# Patient Record
Sex: Female | Born: 1995 | Race: White | Hispanic: No | Marital: Single | Smoking: Never smoker
Health system: Southern US, Community
[De-identification: ages and names within clinical notes are randomized; demographics above are authoritative.]

## PROBLEM LIST (undated history)

## (undated) DIAGNOSIS — F909 Attention-deficit hyperactivity disorder, unspecified type: Secondary | ICD-10-CM

## (undated) DIAGNOSIS — F913 Oppositional defiant disorder: Secondary | ICD-10-CM

## (undated) HISTORY — PX: SHOULDER ARTHROSCOPY: SHX128

## (undated) HISTORY — DX: Oppositional defiant disorder: F91.3

## (undated) HISTORY — PX: SHOULDER SURGERY: SHX246

## (undated) HISTORY — DX: Attention-deficit hyperactivity disorder, unspecified type: F90.9

---
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
## 2014-01-06 ENCOUNTER — Ambulatory Visit: Payer: 59 | Attending: Sports Medicine | Admitting: Physical Therapy

## 2014-01-06 DIAGNOSIS — IMO0001 Reserved for inherently not codable concepts without codable children: Secondary | ICD-10-CM | POA: Insufficient documentation

## 2014-01-06 DIAGNOSIS — M25519 Pain in unspecified shoulder: Secondary | ICD-10-CM | POA: Insufficient documentation

## 2014-01-06 DIAGNOSIS — R5381 Other malaise: Secondary | ICD-10-CM | POA: Insufficient documentation

## 2014-01-07 ENCOUNTER — Ambulatory Visit: Payer: 59 | Admitting: *Deleted

## 2014-01-11 ENCOUNTER — Ambulatory Visit: Payer: 59 | Admitting: Physical Therapy

## 2014-01-12 ENCOUNTER — Ambulatory Visit: Payer: 59 | Admitting: *Deleted

## 2014-01-14 ENCOUNTER — Ambulatory Visit: Payer: 59 | Admitting: *Deleted

## 2014-01-19 ENCOUNTER — Ambulatory Visit: Payer: 59 | Admitting: *Deleted

## 2014-01-20 ENCOUNTER — Encounter: Payer: 59 | Admitting: Physical Therapy

## 2014-01-21 ENCOUNTER — Encounter: Payer: 59 | Admitting: *Deleted

## 2014-01-21 ENCOUNTER — Ambulatory Visit: Payer: 59 | Admitting: *Deleted

## 2014-01-25 ENCOUNTER — Encounter: Payer: 59 | Admitting: Physical Therapy

## 2014-01-26 ENCOUNTER — Ambulatory Visit: Payer: 59 | Admitting: *Deleted

## 2014-01-28 ENCOUNTER — Ambulatory Visit: Payer: 59 | Admitting: *Deleted

## 2014-02-02 ENCOUNTER — Ambulatory Visit: Payer: 59 | Attending: Sports Medicine | Admitting: *Deleted

## 2014-02-02 DIAGNOSIS — M25519 Pain in unspecified shoulder: Secondary | ICD-10-CM | POA: Insufficient documentation

## 2014-02-02 DIAGNOSIS — R5381 Other malaise: Secondary | ICD-10-CM | POA: Insufficient documentation

## 2014-02-02 DIAGNOSIS — IMO0001 Reserved for inherently not codable concepts without codable children: Secondary | ICD-10-CM | POA: Insufficient documentation

## 2014-02-04 ENCOUNTER — Ambulatory Visit: Payer: 59 | Admitting: Physical Therapy

## 2014-02-08 ENCOUNTER — Encounter: Payer: 59 | Admitting: Physical Therapy

## 2014-02-08 ENCOUNTER — Ambulatory Visit: Payer: 59 | Admitting: Physical Therapy

## 2014-02-09 ENCOUNTER — Ambulatory Visit: Payer: 59 | Admitting: *Deleted

## 2014-02-16 ENCOUNTER — Encounter: Payer: 59 | Admitting: Physical Therapy

## 2014-02-18 ENCOUNTER — Encounter: Payer: 59 | Admitting: *Deleted

## 2014-03-05 ENCOUNTER — Encounter (HOSPITAL_COMMUNITY): Payer: Self-pay | Admitting: Emergency Medicine

## 2014-03-05 DIAGNOSIS — F913 Oppositional defiant disorder: Secondary | ICD-10-CM | POA: Insufficient documentation

## 2014-03-05 DIAGNOSIS — G8918 Other acute postprocedural pain: Secondary | ICD-10-CM | POA: Insufficient documentation

## 2014-03-05 DIAGNOSIS — F909 Attention-deficit hyperactivity disorder, unspecified type: Secondary | ICD-10-CM | POA: Insufficient documentation

## 2014-03-05 DIAGNOSIS — Z79899 Other long term (current) drug therapy: Secondary | ICD-10-CM | POA: Insufficient documentation

## 2014-03-05 NOTE — ED Notes (Signed)
The pt is c/o lt shoulder pain since she had ligament surgery on Wednesday. Her pain med has been doubled and she is still in pain.  They were told to come to the ed

## 2014-03-06 ENCOUNTER — Emergency Department (HOSPITAL_COMMUNITY)
Admission: EM | Admit: 2014-03-06 | Discharge: 2014-03-06 | Disposition: A | Discharge status: Home / Self Care | Payer: 59 | Attending: Emergency Medicine | Admitting: Emergency Medicine

## 2014-03-06 DIAGNOSIS — G8918 Other acute postprocedural pain: Secondary | ICD-10-CM

## 2014-03-06 MED ORDER — HYDROMORPHONE HCL PF 1 MG/ML IJ SOLN
1.0000 mg | Freq: Once | INTRAMUSCULAR | Status: AC
  Administered 2014-03-06: 1 mg via INTRAVENOUS
  Filled 2014-03-06: qty 1

## 2014-03-06 MED ORDER — ONDANSETRON HCL 4 MG/2ML IJ SOLN
4.0000 mg | Freq: Once | INTRAMUSCULAR | Status: AC
  Administered 2014-03-06: 4 mg via INTRAVENOUS
  Filled 2014-03-06: qty 2

## 2014-03-06 MED ORDER — KETOROLAC TROMETHAMINE 30 MG/ML IJ SOLN
30.0000 mg | Freq: Once | INTRAMUSCULAR | Status: AC
  Administered 2014-03-06: 30 mg via INTRAVENOUS
  Filled 2014-03-06: qty 1

## 2014-03-06 MED ORDER — OXYCODONE-ACETAMINOPHEN 5-325 MG PO TABS: 2.0000 | ORAL_TABLET | ORAL | Status: DC | PRN

## 2014-03-06 NOTE — Discharge Instructions (Signed)
Pain Relief Preoperatively and Postoperatively °Being a good patient does not mean being a silent one. If you have questions, problems, or concerns about the pain you may feel after surgery, let your caregiver know. Patients have the right to assessment and management of pain. The treatment of pain after surgery is important to speed up recovery and return to normal activities. Severe pain after surgery, and the fear or anxiety associated with that pain, may cause extreme discomfort that: °· Prevents sleep. °· Decreases the ability to breathe deeply and cough. This can cause pneumonia or other upper airway infections. °· Causes your heart to beat faster and your blood pressure to be higher. °· Increases the risk for constipation and bloating. °· Decreases the ability of wounds to heal. °· May result in depression, increased anxiety, and feelings of helplessness. °Relief of pain before surgery is also important because it will lessen the pain after surgery. Patients who receive both pain relief before and after surgery experience greater pain relief than those who only receive pain relief after surgery. Let your caregiver know if you are having uncontrolled pain. This is very important. Pain after surgery is more difficult to manage if it is permitted to become severe, so prompt and adequate treatment of acute pain is necessary. °PAIN CONTROL METHODS °Your caregivers follow policies and procedures about the management of patient pain. These guidelines should be explained to you before surgery. Plans for pain control after surgery must be mutually decided upon and instituted with your full understanding and agreement. Do not be afraid to ask questions regarding the care you are receiving. There are many different ways your caregivers will attempt to control your pain, including the following methods. °As needed pain control °· You may be given pain medicine either through your intravenous (IV) tube, or as a pill or  liquid you can swallow. You will need to let your caregiver know when you are having pain. Then, your caregiver will give you the pain medicine ordered for you. °· Your pain medicine may make you constipated. If constipation occurs, drink more liquids if you can. Your caregiver may have you take a mild laxative. °IV patient-controlled analgesia pump (PCA pump) °· You can get your pain medicine through the IV tube which goes into your vein. You are able to control the amount of pain medicine that you get. The pain medicine flows in through an IV tube and is controlled by a pump. This pump gives you a set amount of pain medicine when you push the button hooked up to it. Nobody should push this button but you or someone specifically assigned by you to do so. It is set up to keep you from accidentally giving yourself too much pain medicine. You will be able to start using your pain pump in the recovery room after your surgery. This method can be helpful for most types of surgery. °· If you are still having too much pain, tell your caregiver. Also, tell your caregiver if you are feeling too sleepy or nauseous. °Continuous epidural pain control °· A thin, soft tube (catheter) is put into your back. Pain medicine flows through the catheter to lessen pain in the part of your body where the surgery is done. Continuous epidural pain control may work best for you if you are having surgery on your chest, abdomen, hip area, or legs. The epidural catheter is usually put into your back just before surgery. The catheter is left in until you can eat and take medicine by mouth. In most cases,   this may take 2 to 3 days. °· Giving pain medicine through the epidural catheter may help you heal faster because: °· Your bowel gets back to normal faster. °· You can get back to eating sooner. °· You can be up and walking sooner. °Medicine that numbs the area (local anesthetic) °· You may receive an injection of pain medicine near where the  pain is (local infiltration). °· You may receive an injection of pain medicine near the nerve that controls the sensation to a specific part of the body (peripheral nerve block). °· Medicine may be put in the spine to block pain (spinal block). °Opioids °· Moderate to moderately severe acute pain after surgery may respond to opioids. Opioids are narcotic pain medicine. Opioids are often combined with non-narcotic medicines to improve pain relief, diminish the risk of side effects, and reduce the chance of addiction. °· If you follow your caregiver's directions about taking opioids and you do not have a history of substance abuse, your risk of becoming addicted is exceptionally small. Opioids are given for short periods of time in careful doses to prevent addiction. °Other methods of pain control include: °· Steroids. °· Physical therapy. °· Heat and cold therapy. °· Compression, such as wrapping an elastic bandage around the area of pain. °· Massage. °These various ways of controlling pain may be used together. Combining different methods of pain control is called multimodal analgesia. Using this approach has many benefits, including being able to eat, move around, and leave the hospital sooner. °Document Released: 03/09/2003 Document Revised: 03/10/2012 Document Reviewed: 03/13/2011 °Exit[employer]® Patient Information ©2014 [employer]. ° °

## 2014-03-06 NOTE — ED Provider Notes (Signed)
CSN: [id number]     Arrival date & time 03/05/14  2315 History   First MD Initiated Contact with Patient 03/06/14 0254     Chief Complaint  Patient presents with  . Arm Pain     (Consider location/radiation/quality/duration/timing/severity/associated sxs/prior Treatment) HPI History provided by patient and her mother bedside. Left shoulder surgery 2 days ago, arthroscopic surgery.  Since that time having severe shoulder pain, wearing sling as directed. She has been taking increased amounts of pain medication per her orthopedic surgeon - today has been taking 3 oxycodone every 4 hours with persistent severe pain. She is also taking Valium every 6 hours. No NSAIDs. No fevers or chills. Mother has been replacing wound the bandages without noted erythema or drainage from wounds. Pain is sharp in quality and severe, worse with any movement. No associated weakness or numbness. Patient unable to sleep and was referred to the emergency department for ongoing pain. Past Medical History  Diagnosis Date  . ADHD (attention deficit hyperactivity disorder)   . Oppositional defiant disorder    History reviewed. No pertinent past surgical history. Family History  Problem Relation Age of Onset  . Bipolar disorder Maternal Aunt   . ADD / ADHD Cousin    History  Substance Use Topics  . Smoking status: Never Smoker   . Smokeless tobacco: Never Used  . Alcohol Use: No   OB History   Grav Para Term Preterm Abortions TAB SAB Ect Mult Living                 Review of Systems  Constitutional: Negative for fever and chills.  Respiratory: Negative for shortness of breath.   Cardiovascular: Negative for chest pain.  Gastrointestinal: Negative for abdominal pain.  Genitourinary: Negative for flank pain.  Musculoskeletal: Negative for back pain, neck pain and neck stiffness.  Skin: Negative for rash.  Neurological: Negative for weakness and numbness.  All other systems reviewed and are  negative.      Allergies  Pine needle and Amoxicillin  Home Medications   Current Outpatient Rx  Name  Route  Sig  Dispense  Refill  . AZURETTE 0.15-0.02/0.01 MG (21/5) tablet   Oral   Take 1 tablet by mouth daily.         . diazepam (VALIUM) 2 MG tablet   Oral   Take 2-4 mg by mouth every 6 (six) hours as needed for muscle spasms.         Marland Kitchen. oxyCODONE (OXY IR/ROXICODONE) 5 MG immediate release tablet   Oral   Take 5-15 mg by mouth every 4 (four) hours as needed for severe pain.          BP 129/80  Pulse 93  Temp(Src) 99.3 F (37.4 C) (Oral)  Resp 18  SpO2 99% Physical Exam  Constitutional: She is oriented to person, place, and time. She appears well-developed and well-nourished.  HENT:  Head: Normocephalic and atraumatic.  Eyes: EOM are normal. Pupils are equal, round, and reactive to light.  Neck: Neck supple.  Cardiovascular: Normal rate, regular rhythm and intact distal pulses.   Pulmonary/Chest: Effort normal and breath sounds normal. No respiratory distress.  Abdominal: Soft. She exhibits no distension.  Musculoskeletal:  Left Upper extremity - sling in place, decreased range of motion at shoulder secondary to pain. Distal motor and sensorium to light touch intact. Equal radial pulses. Tender on the left shoulder with diffuse swelling. Surgical wounds appear well with Steri-Strips in place. No erythema or increased warmth  to touch. No wound drainage.  Neurological: She is alert and oriented to person, place, and time.  Skin: Skin is warm and dry.    ED Course  Procedures (including critical care time) Labs Review Labs Reviewed - No data to display Imaging Review No results found.  IV Dilaudid. IV Zofran. IV Toradol.  5:18 AM on recheck, pain is gone. She feels significantly better has been resting in the ER and feels comfortable with plan discharge home. Patient and her mother are concerned that they will run out of medications over the weekend, she  shows me prescription bottle with 6 pills left in the oxycodone. Will provide short course of oxycodone to get through the weekend and plan followup with orthopedic surgeon on Monday.  Infection precautions provided and verbalizes understood. Wounds are well appearing at this time without evidence of infection. No fevers.  MDM   Diagnosis: Postoperative pain  Shoulder surgery 2 days ago, pain not controlled with oxycodone and Valium at home.  IV medications provided as above including IV narcotics Pain improved Vital signs and nursing notes reviewed and considered - stable and appropriate for discharge at this time.    Sunnie Nielsen, MD 03/06/14 [phone]

## 2014-04-08 ENCOUNTER — Ambulatory Visit: Payer: 59 | Attending: Orthopedic Surgery

## 2014-04-08 DIAGNOSIS — M25519 Pain in unspecified shoulder: Secondary | ICD-10-CM | POA: Diagnosis not present

## 2014-04-08 DIAGNOSIS — Z9889 Other specified postprocedural states: Secondary | ICD-10-CM | POA: Insufficient documentation

## 2014-04-08 DIAGNOSIS — M25619 Stiffness of unspecified shoulder, not elsewhere classified: Secondary | ICD-10-CM | POA: Insufficient documentation

## 2014-04-08 DIAGNOSIS — IMO0001 Reserved for inherently not codable concepts without codable children: Secondary | ICD-10-CM | POA: Diagnosis present

## 2014-04-12 ENCOUNTER — Ambulatory Visit: Payer: 59 | Admitting: Physical Therapy

## 2014-04-12 DIAGNOSIS — IMO0001 Reserved for inherently not codable concepts without codable children: Secondary | ICD-10-CM | POA: Diagnosis not present

## 2014-04-13 ENCOUNTER — Ambulatory Visit: Payer: 59 | Admitting: Physical Therapy

## 2014-04-13 DIAGNOSIS — IMO0001 Reserved for inherently not codable concepts without codable children: Secondary | ICD-10-CM | POA: Diagnosis not present

## 2014-04-15 ENCOUNTER — Ambulatory Visit: Payer: 59 | Admitting: Physical Therapy

## 2014-04-15 DIAGNOSIS — IMO0001 Reserved for inherently not codable concepts without codable children: Secondary | ICD-10-CM | POA: Diagnosis not present

## 2014-04-19 ENCOUNTER — Ambulatory Visit: Payer: 59 | Admitting: *Deleted

## 2014-04-19 DIAGNOSIS — IMO0001 Reserved for inherently not codable concepts without codable children: Secondary | ICD-10-CM | POA: Diagnosis not present

## 2014-04-20 ENCOUNTER — Ambulatory Visit: Payer: 59 | Admitting: *Deleted

## 2014-04-20 DIAGNOSIS — IMO0001 Reserved for inherently not codable concepts without codable children: Secondary | ICD-10-CM | POA: Diagnosis not present

## 2014-04-22 ENCOUNTER — Ambulatory Visit: Payer: 59 | Admitting: Physical Therapy

## 2014-04-22 DIAGNOSIS — IMO0001 Reserved for inherently not codable concepts without codable children: Secondary | ICD-10-CM | POA: Diagnosis not present

## 2014-04-26 ENCOUNTER — Ambulatory Visit: Payer: 59 | Admitting: Physical Therapy

## 2014-04-26 DIAGNOSIS — IMO0001 Reserved for inherently not codable concepts without codable children: Secondary | ICD-10-CM | POA: Diagnosis not present

## 2014-04-27 ENCOUNTER — Ambulatory Visit: Payer: 59 | Admitting: *Deleted

## 2014-04-27 DIAGNOSIS — IMO0001 Reserved for inherently not codable concepts without codable children: Secondary | ICD-10-CM | POA: Diagnosis not present

## 2014-04-29 ENCOUNTER — Encounter: Payer: 59 | Admitting: Physical Therapy

## 2014-05-03 ENCOUNTER — Encounter: Payer: 59 | Admitting: Physical Therapy

## 2014-05-04 ENCOUNTER — Ambulatory Visit: Payer: 59 | Attending: Orthopedic Surgery | Admitting: Physical Therapy

## 2014-05-04 DIAGNOSIS — M25519 Pain in unspecified shoulder: Secondary | ICD-10-CM | POA: Diagnosis not present

## 2014-05-04 DIAGNOSIS — Z9889 Other specified postprocedural states: Secondary | ICD-10-CM | POA: Insufficient documentation

## 2014-05-04 DIAGNOSIS — M25619 Stiffness of unspecified shoulder, not elsewhere classified: Secondary | ICD-10-CM | POA: Insufficient documentation

## 2014-05-04 DIAGNOSIS — IMO0001 Reserved for inherently not codable concepts without codable children: Secondary | ICD-10-CM | POA: Insufficient documentation

## 2014-05-06 ENCOUNTER — Ambulatory Visit: Payer: 59 | Admitting: Physical Therapy

## 2014-05-10 ENCOUNTER — Ambulatory Visit: Payer: 59 | Admitting: Physical Therapy

## 2014-05-10 DIAGNOSIS — IMO0001 Reserved for inherently not codable concepts without codable children: Secondary | ICD-10-CM | POA: Diagnosis not present

## 2014-05-11 ENCOUNTER — Ambulatory Visit: Payer: 59 | Admitting: *Deleted

## 2014-05-11 DIAGNOSIS — IMO0001 Reserved for inherently not codable concepts without codable children: Secondary | ICD-10-CM | POA: Diagnosis not present

## 2014-05-13 ENCOUNTER — Ambulatory Visit: Payer: 59 | Admitting: *Deleted

## 2014-05-13 DIAGNOSIS — IMO0001 Reserved for inherently not codable concepts without codable children: Secondary | ICD-10-CM | POA: Diagnosis not present

## 2014-05-17 ENCOUNTER — Ambulatory Visit: Payer: 59 | Admitting: *Deleted

## 2014-05-17 DIAGNOSIS — IMO0001 Reserved for inherently not codable concepts without codable children: Secondary | ICD-10-CM | POA: Diagnosis not present

## 2014-05-18 ENCOUNTER — Ambulatory Visit: Payer: 59 | Admitting: Physical Therapy

## 2014-05-18 DIAGNOSIS — IMO0001 Reserved for inherently not codable concepts without codable children: Secondary | ICD-10-CM | POA: Diagnosis not present

## 2014-05-20 ENCOUNTER — Ambulatory Visit: Payer: 59 | Admitting: Physical Therapy

## 2014-05-20 DIAGNOSIS — IMO0001 Reserved for inherently not codable concepts without codable children: Secondary | ICD-10-CM | POA: Diagnosis not present

## 2014-05-25 ENCOUNTER — Ambulatory Visit: Payer: 59 | Admitting: Physical Therapy

## 2014-05-25 DIAGNOSIS — IMO0001 Reserved for inherently not codable concepts without codable children: Secondary | ICD-10-CM | POA: Diagnosis not present

## 2014-05-27 ENCOUNTER — Encounter: Payer: 59 | Admitting: *Deleted

## 2014-05-31 ENCOUNTER — Ambulatory Visit: Payer: 59 | Attending: Orthopedic Surgery | Admitting: Physical Therapy

## 2014-05-31 DIAGNOSIS — IMO0001 Reserved for inherently not codable concepts without codable children: Secondary | ICD-10-CM | POA: Insufficient documentation

## 2014-05-31 DIAGNOSIS — Z9889 Other specified postprocedural states: Secondary | ICD-10-CM | POA: Diagnosis not present

## 2014-05-31 DIAGNOSIS — M25619 Stiffness of unspecified shoulder, not elsewhere classified: Secondary | ICD-10-CM | POA: Diagnosis not present

## 2014-05-31 DIAGNOSIS — M25519 Pain in unspecified shoulder: Secondary | ICD-10-CM | POA: Diagnosis not present

## 2014-06-01 ENCOUNTER — Ambulatory Visit: Payer: 59 | Admitting: *Deleted

## 2014-06-01 DIAGNOSIS — IMO0001 Reserved for inherently not codable concepts without codable children: Secondary | ICD-10-CM | POA: Diagnosis not present

## 2014-06-03 ENCOUNTER — Ambulatory Visit: Payer: 59 | Admitting: *Deleted

## 2014-06-03 DIAGNOSIS — IMO0001 Reserved for inherently not codable concepts without codable children: Secondary | ICD-10-CM | POA: Diagnosis not present

## 2014-06-07 ENCOUNTER — Ambulatory Visit: Payer: 59 | Admitting: Physical Therapy

## 2014-06-07 DIAGNOSIS — IMO0001 Reserved for inherently not codable concepts without codable children: Secondary | ICD-10-CM | POA: Diagnosis not present

## 2014-06-08 ENCOUNTER — Ambulatory Visit: Payer: 59 | Admitting: Physical Therapy

## 2014-06-08 DIAGNOSIS — IMO0001 Reserved for inherently not codable concepts without codable children: Secondary | ICD-10-CM | POA: Diagnosis not present

## 2014-06-10 ENCOUNTER — Ambulatory Visit: Payer: 59 | Admitting: *Deleted

## 2014-06-10 DIAGNOSIS — IMO0001 Reserved for inherently not codable concepts without codable children: Secondary | ICD-10-CM | POA: Diagnosis not present

## 2014-06-18 ENCOUNTER — Ambulatory Visit: Payer: 59 | Admitting: Physical Therapy

## 2014-06-18 DIAGNOSIS — IMO0001 Reserved for inherently not codable concepts without codable children: Secondary | ICD-10-CM | POA: Diagnosis not present

## 2014-07-01 ENCOUNTER — Ambulatory Visit: Payer: 59 | Attending: Orthopedic Surgery | Admitting: *Deleted

## 2014-07-01 DIAGNOSIS — M25619 Stiffness of unspecified shoulder, not elsewhere classified: Secondary | ICD-10-CM | POA: Diagnosis not present

## 2014-07-01 DIAGNOSIS — Z9889 Other specified postprocedural states: Secondary | ICD-10-CM | POA: Insufficient documentation

## 2014-07-01 DIAGNOSIS — M25519 Pain in unspecified shoulder: Secondary | ICD-10-CM | POA: Insufficient documentation

## 2014-07-01 DIAGNOSIS — IMO0001 Reserved for inherently not codable concepts without codable children: Secondary | ICD-10-CM | POA: Diagnosis present

## 2014-11-30 IMAGING — CR DG SHOULDER 2+V*L*
3 series · 3 of 3 positions shown · non-contrast
Comparison: None.

CLINICAL DATA: Left shoulder pain following an injury.

EXAM:
LEFT SHOULDER - 2+ VIEW

[w shoulder ap internal left]
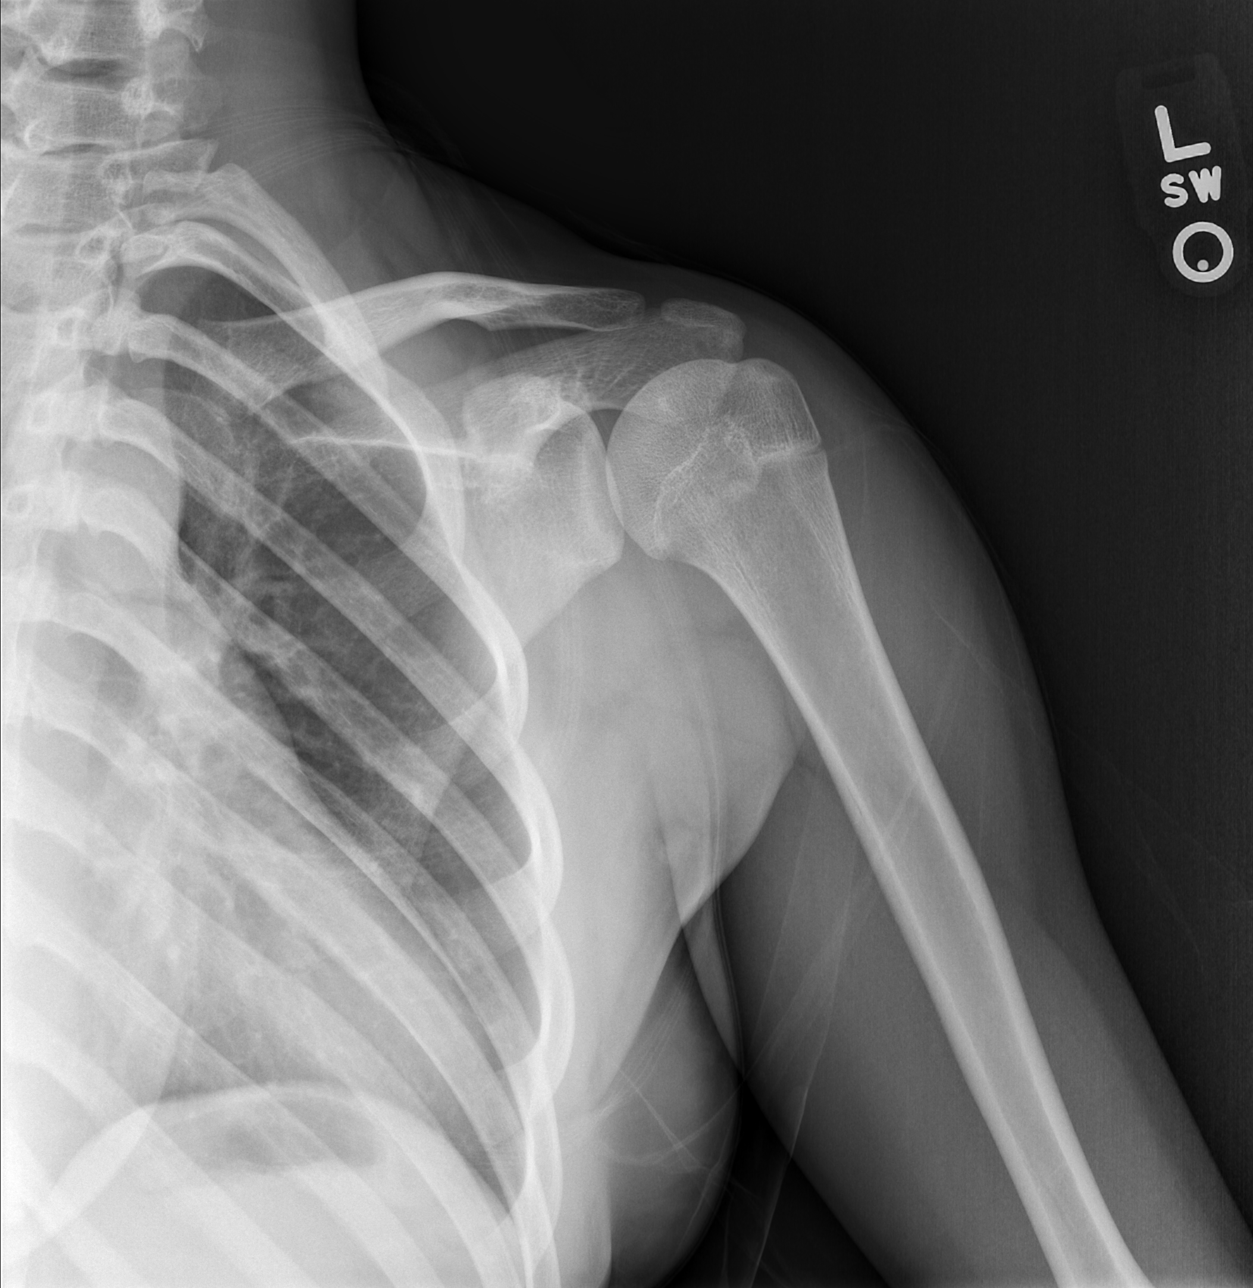

[w shoulder ap external left]
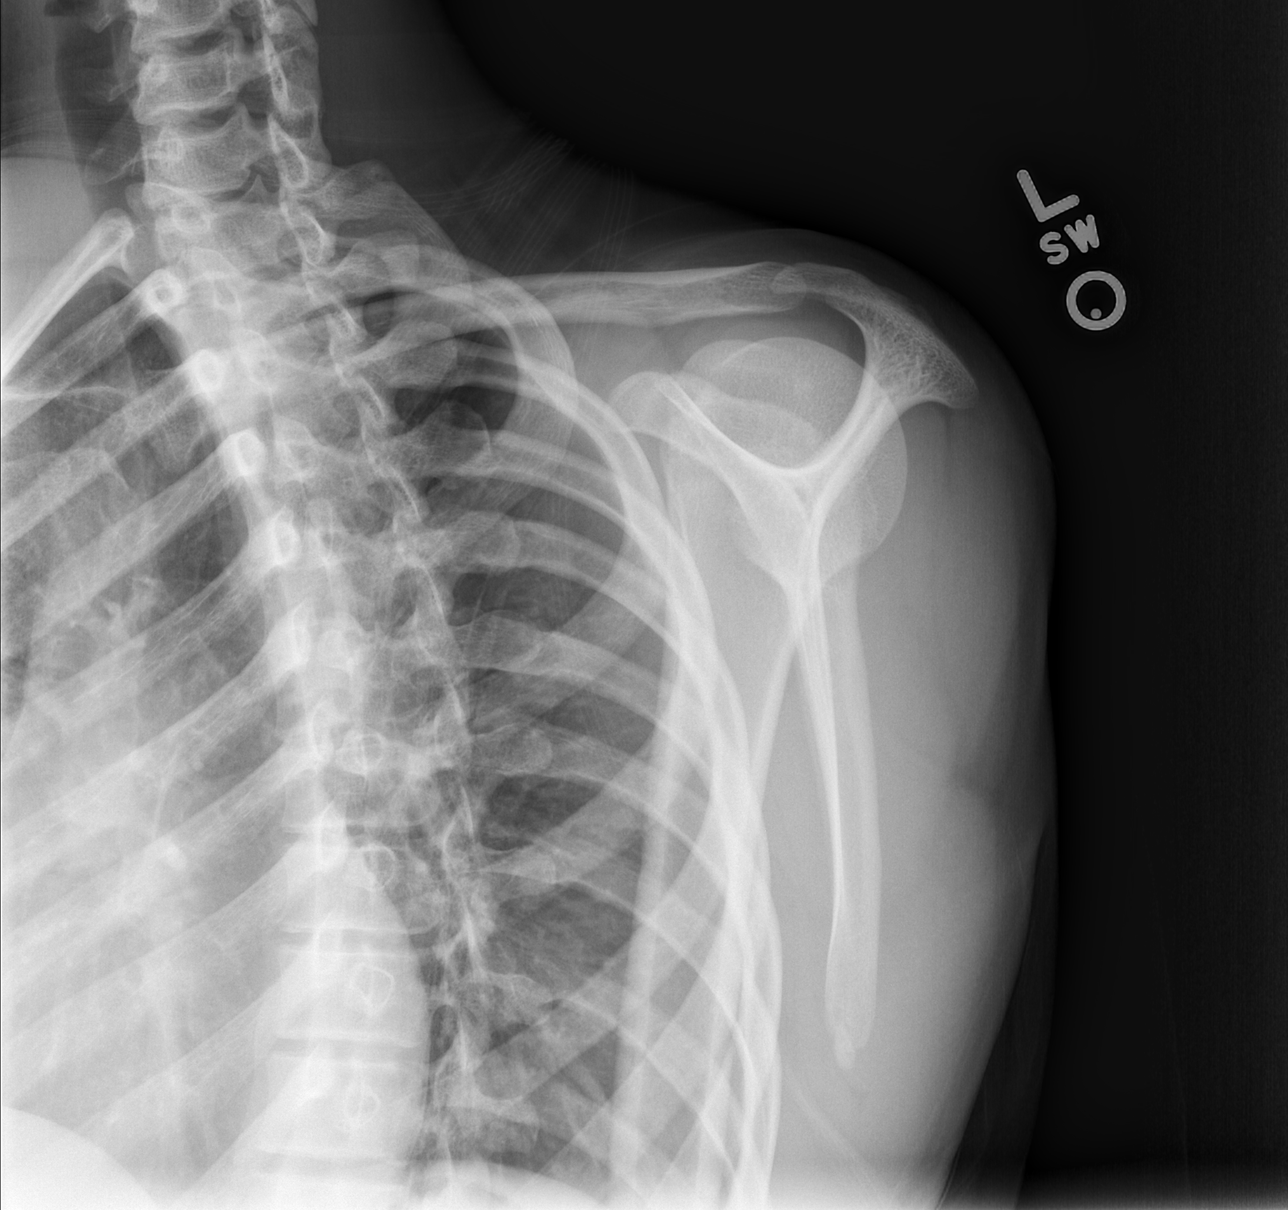

[x shoulder axillary left]
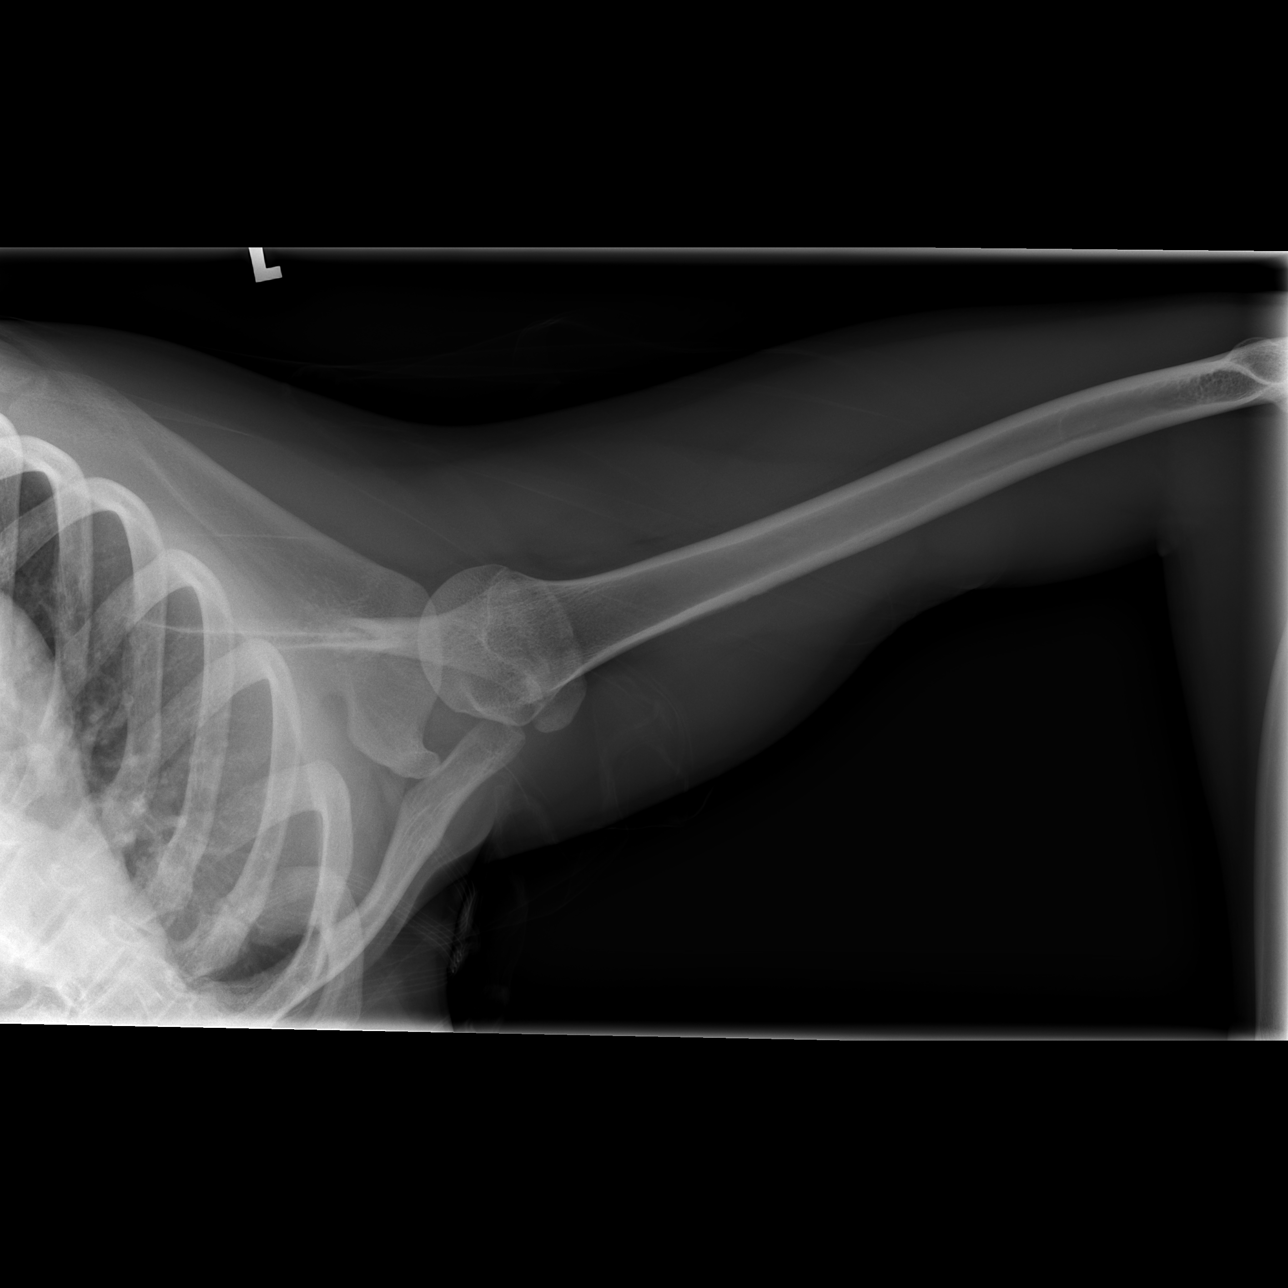

[3 of 3 positions shown; findings below may reference images not displayed]

FINDINGS: There is no evidence of fracture or dislocation. There is no
evidence of arthropathy or other focal bone abnormality. Soft
tissues are unremarkable.
IMPRESSION: Normal examination.

## 2016-06-14 ENCOUNTER — Telehealth (HOSPITAL_COMMUNITY): Payer: Self-pay

## 2016-06-14 NOTE — Telephone Encounter (Signed)
Patient is calling, she is a former patient of yours, she was last seen in 2011. She is looking for a psychiatrist and would like to know who you would recommend. Patient states that she feels more comfortable with a female. Please review and advise, thank you.

## 2016-06-14 NOTE — Telephone Encounter (Signed)
She can see Dr. Michae KavaAgarwal or Dr[facility]

## 2016-06-14 NOTE — Telephone Encounter (Signed)
I called the patient back and let her know what Dr. Lucianne MussKumar said. Patient voiced understanding and said she will probably call Dr. Garnetta BuddyFaheem

## 2017-05-30 ENCOUNTER — Telehealth (HOSPITAL_COMMUNITY): Payer: Self-pay | Admitting: Psychiatry

## 2017-07-12 ENCOUNTER — Ambulatory Visit (INDEPENDENT_AMBULATORY_CARE_PROVIDER_SITE_OTHER): Payer: BLUE CROSS/BLUE SHIELD | Admitting: Physician Assistant

## 2017-07-12 ENCOUNTER — Encounter: Payer: Self-pay | Admitting: Physician Assistant

## 2017-07-12 VITALS — BP 128/88 | HR 73 | Temp 97.4°F | Ht 63.0 in | Wt 130.4 lb

## 2017-07-12 DIAGNOSIS — F321 Major depressive disorder, single episode, moderate: Secondary | ICD-10-CM | POA: Diagnosis not present

## 2017-07-12 DIAGNOSIS — S91312D Laceration without foreign body, left foot, subsequent encounter: Secondary | ICD-10-CM | POA: Diagnosis not present

## 2017-07-12 MED ORDER — CITALOPRAM HYDROBROMIDE 20 MG PO TABS: 20.0000 mg | ORAL_TABLET | Freq: Every day | ORAL | 5 refills | Status: DC

## 2017-07-12 NOTE — Patient Instructions (Signed)

## 2017-07-12 NOTE — Progress Notes (Addendum)
BP 128/88   Pulse 73   Temp (!) 97.4 F (36.3 C) (Oral)   Ht 5' 3"  (1.6 m)   Wt 130 lb 6.4 oz (59.1 kg)   LMP 06/27/2017   BMI 23.10 kg/m    Subjective:    Patient ID: Makayla Peterson, female    DOB: 1996-11-02, 21 y.o.   MRN: [id number]  HPI: Makayla Peterson is a 21 y.o. female presenting on 07/12/2017 for Depression (Patient was emotional and crying); Anxiety; and Suture / Staple Removal (left foot)  Depression screen [facility] 2/9 07/12/2017  Decreased Interest 2  Down, Depressed, Hopeless 3  PHQ - 2 Score 5  Altered sleeping 2  Tired, decreased energy 2  Change in appetite 3  Feeling bad or failure about yourself  2  Trouble concentrating 1  Moving slowly or fidgety/restless 1  Suicidal thoughts 1  PHQ-9 Score 17    She had a distant idea of suicide about 2 years ago. She does not have a plan at this time. And she contracts for safety. She is in a situation where she has an academic scholarship patient but it is on the wrestling team at [employer]. The college wrestling team has not met her expectations. 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 She is having some difficulties with her mom and relationships. She has a boyfriend and he is supportive but they do occasionally fight. She denies any fear of abuse.  The patient has been ascended from an assistance animal. She has a dog. When she is with him she has much better mood and is less anxious. She is also having less suicidal thoughts. It may be possible for her to have the assistance animal in her dorm when she returns to college. A letter will be sent to college housing to try to accommodate this.  Relevant past medical, surgical, family and social history reviewed and updated as indicated. Allergies and medications reviewed and updated.  Past Medical History:  Diagnosis Date  . ADHD (attention deficit hyperactivity disorder)   . Oppositional defiant disorder     Past Surgical History:    Procedure Laterality Date  . SHOULDER SURGERY Left     Review of Systems  Constitutional: Negative.  Negative for activity change, fatigue and fever.  HENT: Negative.   Eyes: Negative.   Respiratory: Negative.  Negative for cough.   Cardiovascular: Negative.  Negative for chest pain.  Gastrointestinal: Negative.  Negative for abdominal pain.  Endocrine: Negative.   Genitourinary: Negative.  Negative for dysuria.  Musculoskeletal: Negative.   Skin: Negative.   Neurological: Negative.   Psychiatric/Behavioral: Positive for decreased concentration, dysphoric mood and sleep disturbance. Negative for self-injury and suicidal ideas. The patient is nervous/anxious.     Allergies as of 07/12/2017      Reactions   Pine Needle [fir Oil] Shortness Of Breath   Amoxicillin Rash      Medication List       Accurate as of 07/12/17 10:01 AM. Always use your most recent med list.          AZURETTE 0.15-0.02/0.01 MG (21/5) tablet Generic drug:  desogestrel-ethinyl estradiol Take 1 tablet by mouth daily.   citalopram 20 MG tablet Commonly known as:  CELEXA Take 1 tablet (20 mg total) by mouth daily.          Objective:    BP 128/88   Pulse 73   Temp (!) 97.4 F (36.3 C) (Oral)  Ht 5' 3"  (1.6 m)   Wt 130 lb 6.4 oz (59.1 kg)   LMP 06/27/2017   BMI 23.10 kg/m   Allergies  Allergen Reactions  . Pine Needle [Fir Oil] Shortness Of Breath  . Amoxicillin Rash    Physical Exam  Constitutional: She is oriented to person, place, and time. She appears well-developed and well-nourished.  HENT:  Head: Normocephalic and atraumatic.  Eyes: Pupils are equal, round, and reactive to light. Conjunctivae and EOM are normal.  Cardiovascular: Normal rate, regular rhythm, normal heart sounds and intact distal pulses.   Pulmonary/Chest: Effort normal and breath sounds normal.  Abdominal: Soft. Bowel sounds are normal.  Neurological: She is alert and oriented to person, place, and time. She  has normal reflexes.  Skin: Skin is warm and dry. No rash noted.  Psychiatric: Judgment and thought content normal. She is slowed and withdrawn. Cognition and memory are normal. She exhibits a depressed mood.    No results found for this or any previous visit.    Assessment & Plan:   1. Depression, major, single episode, moderate (HCC) - citalopram (CELEXA) 20 MG tablet; Take 1 tablet (20 mg total) by mouth daily.  Dispense: 30 tablet; Refill: 5  2. Laceration of left foot, subsequent encounter 5 simple interrupted sutures are removed today by me. There is good closure of the wound. Further wound care sections are given to the patient.  Continue all other maintenance medications as listed above.  Follow up plan: Return in about 4 weeks (around 08/09/2017) for recheck.  Educational handout given for depression  Terald Sleeper PA-C [place] [address]  [place],  [address] [phone]   07/12/2017, 10:01 AM

## 2017-07-15 ENCOUNTER — Encounter: Payer: Self-pay | Admitting: Physician Assistant

## 2017-08-09 ENCOUNTER — Ambulatory Visit (INDEPENDENT_AMBULATORY_CARE_PROVIDER_SITE_OTHER): Payer: BLUE CROSS/BLUE SHIELD | Admitting: Physician Assistant

## 2017-08-09 ENCOUNTER — Encounter: Payer: Self-pay | Admitting: Physician Assistant

## 2017-08-09 VITALS — BP 125/86 | HR 57 | Temp 96.7°F | Ht 63.0 in | Wt 129.2 lb

## 2017-08-09 DIAGNOSIS — F321 Major depressive disorder, single episode, moderate: Secondary | ICD-10-CM | POA: Diagnosis not present

## 2017-08-09 MED ORDER — CITALOPRAM HYDROBROMIDE 20 MG PO TABS: 20.0000 mg | ORAL_TABLET | Freq: Every day | ORAL | 2 refills | Status: DC

## 2017-08-09 MED ORDER — DESOGESTREL-ETHINYL ESTRADIOL 0.15-0.02/0.01 MG (21/5) PO TABS: 1.0000 | ORAL_TABLET | Freq: Every day | ORAL | 12 refills | Status: DC

## 2017-08-09 NOTE — Patient Instructions (Signed)
In a few days you may receive a survey in the mail or online from [employer] regarding your visit with us today. Please take a moment to fill this out. Your feedback is very important to our whole office. It can help us better understand your needs as well as improve your experience and satisfaction. Thank you for taking your time to complete it. We care about you.  Reita Shindler, PA-C  

## 2017-08-09 NOTE — Progress Notes (Signed)
BP 125/86   Pulse (!) 57   Temp (!) 96.7 F (35.9 C) (Oral)   Ht 5\' 3"  (1.6 m)   Wt 129 lb 3.2 oz (58.6 kg)   LMP 08/05/2017   BMI 22.89 kg/m    Subjective:    Patient ID: Makayla Peterson, female    DOB: 04/12/1996, 21 y.o.   MRN: [id number]  HPI: Makayla Peterson is a 21 y.o. female presenting on 08/09/2017 for Depression (4 wk rck)  This patient comes in for periodic recheck on medications and conditions including depression. She reports that there is been a significant improvement in her overall mood and feelings. The pH Q9 shows this also.  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 She is also able to keep her dog with her as comfort measures. Depression screen [facility] 2/9 08/09/2017 07/12/2017  Decreased Interest 1 2  Down, Depressed, Hopeless 1 3  PHQ - 2 Score 2 5  Altered sleeping 2 2  Tired, decreased energy 1 2  Change in appetite 1 3  Feeling bad or failure about yourself  1 2  Trouble concentrating 2 1  Moving slowly or fidgety/restless 0 1  Suicidal thoughts 0 1  PHQ-9 Score 9 17   .   All medications are reviewed today. There are no reports of any problems with the medications. All of the medical conditions are reviewed and updated.  Lab work is reviewed and will be ordered as medically necessary. There are no new problems reported with today's visit.   Relevant past medical, surgical, family and social history reviewed and updated as indicated. Allergies and medications reviewed and updated.  Past Medical History:  Diagnosis Date  . ADHD (attention deficit hyperactivity disorder)   . Oppositional defiant disorder     Past Surgical History:  Procedure Laterality Date  . SHOULDER SURGERY Left     Review of Systems  Constitutional: Negative.   HENT: Negative.   Eyes: Negative.   Respiratory: Negative.   Gastrointestinal: Negative.   Genitourinary: Negative.   Psychiatric/Behavioral: Positive for decreased concentration  and dysphoric mood. Negative for sleep disturbance and suicidal ideas.    Allergies as of 08/09/2017      Reactions   Pine Needle [fir Oil] Shortness Of Breath   Amoxicillin Rash      Medication List       Accurate as of 08/09/17  9:01 AM. Always use your most recent med list.          AZURETTE 0.15-0.02/0.01 MG (21/5) tablet Generic drug:  desogestrel-ethinyl estradiol Take 1 tablet by mouth daily.   citalopram 20 MG tablet Commonly known as:  CELEXA Take 1 tablet (20 mg total) by mouth daily.          Objective:    BP 125/86   Pulse (!) 57   Temp (!) 96.7 F (35.9 C) (Oral)   Ht 5\' 3"  (1.6 m)   Wt 129 lb 3.2 oz (58.6 kg)   LMP 08/05/2017   BMI 22.89 kg/m   Allergies  Allergen Reactions  . Pine Needle [Fir Oil] Shortness Of Breath  . Amoxicillin Rash    Physical Exam  Constitutional: She is oriented to person, place, and time. She appears well-developed and well-nourished.  HENT:  Head: Normocephalic and atraumatic.  Eyes: Pupils are equal, round, and reactive to light. Conjunctivae and EOM are normal.  Cardiovascular: Normal rate, regular rhythm, normal heart sounds and intact distal  pulses.   Pulmonary/Chest: Effort normal and breath sounds normal.  Abdominal: Soft. Bowel sounds are normal.  Neurological: She is alert and oriented to person, place, and time. She has normal reflexes.  Skin: Skin is warm and dry. No rash noted.  Psychiatric: She has a normal mood and affect. Her behavior is normal. Judgment and thought content normal.    No results found for this or any previous visit.    Assessment & Plan:   1. Depression, major, single episode, moderate (HCC) - citalopram (CELEXA) 20 MG tablet; Take 1 tablet (20 mg total) by mouth daily.  Dispense: 30 tablet; Refill: 2   Current Outpatient Prescriptions:  .  AZURETTE 0.15-0.02/0.01 MG (21/5) tablet, Take 1 tablet by mouth daily., Disp: , Rfl:  .  citalopram (CELEXA) 20 MG tablet, Take 1 tablet (20  mg total) by mouth daily., Disp: 30 tablet, Rfl: 5 Continue all other maintenance medications as listed above.  Follow up plan: Recheck 6 months  Educational handout given for survey  Remus LofflerAngel S. Josanna Hefel PA-C [facility] [address]  [place], [place] [id number] [phone]   08/09/2017, 9:01 AM

## 2017-09-24 ENCOUNTER — Encounter: Payer: Self-pay | Admitting: Family Medicine

## 2017-09-24 ENCOUNTER — Ambulatory Visit (INDEPENDENT_AMBULATORY_CARE_PROVIDER_SITE_OTHER): Payer: BLUE CROSS/BLUE SHIELD | Admitting: Family Medicine

## 2017-09-24 VITALS — BP 126/79 | HR 62 | Temp 97.2°F | Ht 63.0 in | Wt 133.0 lb

## 2017-09-24 DIAGNOSIS — J4520 Mild intermittent asthma, uncomplicated: Secondary | ICD-10-CM | POA: Insufficient documentation

## 2017-09-24 DIAGNOSIS — J329 Chronic sinusitis, unspecified: Secondary | ICD-10-CM | POA: Diagnosis not present

## 2017-09-24 DIAGNOSIS — J40 Bronchitis, not specified as acute or chronic: Secondary | ICD-10-CM | POA: Diagnosis not present

## 2017-09-24 MED ORDER — LEVOFLOXACIN 500 MG PO TABS: 500.0000 mg | ORAL_TABLET | Freq: Every day | ORAL | 0 refills | Status: DC

## 2017-09-24 MED ORDER — BETAMETHASONE SOD PHOS & ACET 6 (3-3) MG/ML IJ SUSP
6.0000 mg | Freq: Once | INTRAMUSCULAR | Status: AC
  Administered 2017-09-24: 6 mg via INTRAMUSCULAR

## 2017-09-24 MED ORDER — ALBUTEROL SULFATE HFA 108 (90 BASE) MCG/ACT IN AERS: 2.0000 | INHALATION_SPRAY | Freq: Four times a day (QID) | RESPIRATORY_TRACT | 3 refills | Status: DC | PRN

## 2017-09-24 NOTE — Progress Notes (Signed)
Chief Complaint  Patient presents with  . Cough    pt here today c/o cough, congestion and nasal drainage.    HPI  Patient presents today for Patient presents with upper respiratory congestion. Rhinorrhea that is frequently purulent. There is moderate sore throat. Patient reports coughing frequently as well.  Rusty green sputum noted. There is no fever, chills or sweats. The patient denies being short of breath. Onset was 3 days ago. Getting worse.Patient endorses history of exercise-induced asthma and needs a refill on her Ventolin inhaler.  PMH: Smoking status noted ROS: Per HPI  Objective: BP 126/79   Pulse 62   Temp (!) 97.2 F (36.2 C) (Oral)   Ht  (1.6 m)   Wt 133 lb (60.3 kg)   BMI 23.56 kg/m  Gen: NAD, alert, cooperative with exam HEENT: NCAT, EOMI, PERRL. TMs clear. Nasal passages erythematous maxillary sinuses tender lungs have bronchitic character. Good exchange rare rhonchus. CV: RRR, good S1/S2, no murmur Resp: CTABL, no wheezes, non-labored Abd: SNTND, BS present, no guarding or organomegaly Ext: No edema, warm Neuro: Alert and oriented, No gross deficits  Assessment and plan:  1. Sinobronchitis   2. Mild intermittent asthma without complication     Meds ordered this encounter  Medications  . levofloxacin (LEVAQUIN) 500 MG tablet    Sig: Take 1 tablet (500 mg total) by mouth daily.    Dispense:  10 tablet    Refill:  0  . albuterol (PROVENTIL HFA;VENTOLIN HFA) 108 (90 Base) MCG/ACT inhaler    Sig: Inhale 2 puffs into the lungs every 6 (six) hours as needed for wheezing or shortness of breath.    Dispense:  1 Inhaler    Refill:  3  . betamethasone acetate-betamethasone sodium phosphate (CELESTONE) injection 6 mg    No orders of the defined types were placed in this encounter.   Follow up as needed.  Mechele Claude, MD

## 2017-10-14 ENCOUNTER — Encounter: Payer: Self-pay | Admitting: Family Medicine

## 2017-10-14 ENCOUNTER — Other Ambulatory Visit: Payer: Self-pay | Admitting: *Deleted

## 2017-10-14 ENCOUNTER — Other Ambulatory Visit: Payer: Self-pay | Admitting: Family Medicine

## 2017-10-14 MED ORDER — LEVOFLOXACIN 500 MG PO TABS: 500.0000 mg | ORAL_TABLET | Freq: Every day | ORAL | 0 refills | Status: DC

## 2017-11-19 ENCOUNTER — Ambulatory Visit (INDEPENDENT_AMBULATORY_CARE_PROVIDER_SITE_OTHER): Payer: BLUE CROSS/BLUE SHIELD | Admitting: Physician Assistant

## 2017-11-19 ENCOUNTER — Encounter: Payer: Self-pay | Admitting: Physician Assistant

## 2017-11-19 VITALS — BP 137/73 | HR 70 | Temp 97.2°F | Ht 63.0 in | Wt 128.4 lb

## 2017-11-19 DIAGNOSIS — J209 Acute bronchitis, unspecified: Secondary | ICD-10-CM | POA: Diagnosis not present

## 2017-11-19 DIAGNOSIS — F321 Major depressive disorder, single episode, moderate: Secondary | ICD-10-CM | POA: Diagnosis not present

## 2017-11-19 MED ORDER — METHYLPREDNISOLONE ACETATE 80 MG/ML IJ SUSP
80.0000 mg | Freq: Once | INTRAMUSCULAR | Status: AC
  Administered 2017-11-19: 80 mg via INTRAMUSCULAR

## 2017-11-19 MED ORDER — CITALOPRAM HYDROBROMIDE 20 MG PO TABS: 20.0000 mg | ORAL_TABLET | Freq: Every day | ORAL | 5 refills | Status: DC

## 2017-11-19 MED ORDER — CEFDINIR 300 MG PO CAPS: 300.0000 mg | ORAL_CAPSULE | Freq: Two times a day (BID) | ORAL | 0 refills | Status: DC

## 2017-11-19 NOTE — Progress Notes (Signed)
BP 137/73   Pulse 70   Temp (!) 97.2 F (36.2 C) (Oral)   Ht 5\' 3"  (1.6 m)   Wt 128 lb 6.4 oz (58.2 kg)   BMI 22.75 kg/m    Subjective:    Patient ID: Makayla RichesAutumn Broyles, female    DOB: [birth year], 21 y.o.   MRN: [id number]  HPI: Makayla Richesutumn Surles is a 21 y.o. female presenting on 11/19/2017 for Cough (x 2 months); Nasal Congestion; and Headache  Patient with several days of progressing upper respiratory and bronchial symptoms. Initially there was more upper respiratory congestion. This progressed to having significant cough that is productive throughout the day and severe at night. There is occasional wheezing after coughing. Sometimes there is slight dyspnea on exertion. It is productive mucus that is yellow in color. Denies any blood. Asthma symptoms seem to be more irritated.  She is doing a lot of exercise and her coughing and wheezing is more agitated with that.  I encouraged her to use her albuterol inhaler up to 4 times a day if needed for this tightness and bronchospasm. [facility] 11/19/2017 08/09/2017 07/12/2017  Decreased Interest 0 1 2  Down, Depressed, Hopeless 1 1 3   PHQ - 2 Score 1 2 5   Altered sleeping - 2 2  Tired, decreased energy - 1 2  Change in appetite - 1 3  Feeling bad or failure about yourself  - 1 2  Trouble concentrating - 2 1  Moving slowly or fidgety/restless - 0 1  Suicidal thoughts - 0 1  PHQ-9 Score - 9 17   She has had a huge improvement in her depression scores.  Relevant past medical, surgical, family and social history reviewed and updated as indicated. Allergies and medications reviewed and updated.  Past Medical History:  Diagnosis Date  . ADHD (attention deficit hyperactivity disorder)   . Oppositional defiant disorder     Past Surgical History:  Procedure Laterality Date  . SHOULDER SURGERY Left     Review of Systems  Constitutional: Positive for chills and fatigue. Negative for activity change, appetite change and fever.    HENT: Positive for congestion, postnasal drip and sore throat. Negative for sinus pressure and sinus pain.   Eyes: Negative.   Respiratory: Positive for cough, shortness of breath and wheezing.   Cardiovascular: Negative.  Negative for chest pain, palpitations and leg swelling.  Gastrointestinal: Negative.   Genitourinary: Negative.   Musculoskeletal: Negative.   Skin: Negative.   Neurological: Negative for headaches.    Allergies as of 11/19/2017      Reactions   Pine Needle [fir Oil] Shortness Of Breath   Amoxicillin Rash      Medication List        Accurate as of 11/19/17  2:53 PM. Always use your most recent med list.          albuterol 108 (90 Base) MCG/ACT inhaler Commonly known as:  PROVENTIL HFA;VENTOLIN HFA Inhale 2 puffs into the lungs every 6 (six) hours as needed for wheezing or shortness of breath.   cefdinir 300 MG capsule Commonly known as:  OMNICEF Take 1 capsule (300 mg total) by mouth 2 (two) times daily. 1 po BID   citalopram 20 MG tablet Commonly known as:  CELEXA Take 1 tablet (20 mg total) by mouth daily.   desogestrel-ethinyl estradiol 0.15-0.02/0.01 MG (21/5) tablet Commonly known as:  AZURETTE Take 1 tablet by mouth daily.          Objective:  BP 137/73   Pulse 70   Temp (!) 97.2 F (36.2 C) (Oral)   Ht 5\' 3"  (1.6 m)   Wt 128 lb 6.4 oz (58.2 kg)   BMI 22.75 kg/m   Allergies  Allergen Reactions  . Pine Needle [Fir Oil] Shortness Of Breath  . Amoxicillin Rash    Physical Exam  Constitutional: She is oriented to person, place, and time. She appears well-developed and well-nourished.  HENT:  Head: Normocephalic and atraumatic.  Right Ear: There is drainage and tenderness.  Left Ear: There is drainage and tenderness.  Nose: Mucosal edema and rhinorrhea present. Right sinus exhibits maxillary sinus tenderness and frontal sinus tenderness. Left sinus exhibits maxillary sinus tenderness and frontal sinus tenderness.   Mouth/Throat: Oropharyngeal exudate and posterior oropharyngeal erythema present.  Eyes: Conjunctivae and EOM are normal. Pupils are equal, round, and reactive to light.  Neck: Normal range of motion. Neck supple.  Cardiovascular: Normal rate, regular rhythm, normal heart sounds and intact distal pulses.  Pulmonary/Chest: Effort normal. She has wheezes in the right upper field and the left upper field.  Abdominal: Soft. Bowel sounds are normal.  Neurological: She is alert and oriented to person, place, and time. She has normal reflexes.  Skin: Skin is warm and dry. No rash noted.  Psychiatric: She has a normal mood and affect. Her behavior is normal. Judgment and thought content normal.    No results found for this or any previous visit.    Assessment & Plan:   1. Depression, major, single episode, moderate (HCC) - citalopram (CELEXA) 20 MG tablet; Take 1 tablet (20 mg total) by mouth daily.  Dispense: 30 tablet; Refill: 5  2. Bronchitis, acute, with bronchospasm - cefdinir (OMNICEF) 300 MG capsule; Take 1 capsule (300 mg total) by mouth 2 (two) times daily. 1 po BID  Dispense: 20 capsule; Refill: 0 - methylPREDNISolone acetate (DEPO-MEDROL) injection 80 mg    Current Outpatient Medications:  .  albuterol (PROVENTIL HFA;VENTOLIN HFA) 108 (90 Base) MCG/ACT inhaler, Inhale 2 puffs into the lungs every 6 (six) hours as needed for wheezing or shortness of breath., Disp: 1 Inhaler, Rfl: 3 .  cefdinir (OMNICEF) 300 MG capsule, Take 1 capsule (300 mg total) by mouth 2 (two) times daily. 1 po BID, Disp: 20 capsule, Rfl: 0 .  citalopram (CELEXA) 20 MG tablet, Take 1 tablet (20 mg total) by mouth daily., Disp: 30 tablet, Rfl: 5 .  desogestrel-ethinyl estradiol (AZURETTE) 0.15-0.02/0.01 MG (21/5) tablet, Take 1 tablet by mouth daily., Disp: 1 Package, Rfl: 12  Current Facility-Administered Medications:  .  methylPREDNISolone acetate (DEPO-MEDROL) injection 80 mg, 80 mg, Intramuscular, Once,  Saintclair Schroader S, PA-C Continue all other maintenance medications as listed above.  Follow up plan: Return in about 6 months (around 05/19/2018).  Educational handout given for survey  Remus LofflerAngel S. Miley Lindon PA-C [facility] [address]  [place], [place] [id number] [phone]   11/19/2017, 2:53 PM

## 2017-11-19 NOTE — Patient Instructions (Signed)
In a few days you may receive a survey in the mail or online from [employer] regarding your visit with us today. Please take a moment to fill this out. Your feedback is very important to our whole office. It can help us better understand your needs as well as improve your experience and satisfaction. Thank you for taking your time to complete it. We care about you.  Doctor Sheahan, PA-C  

## 2018-01-03 ENCOUNTER — Encounter: Payer: Self-pay | Admitting: *Deleted

## 2018-01-15 ENCOUNTER — Encounter: Payer: Self-pay | Admitting: Physician Assistant

## 2018-01-15 ENCOUNTER — Other Ambulatory Visit: Payer: Self-pay | Admitting: Physician Assistant

## 2018-01-15 MED ORDER — AZITHROMYCIN 250 MG PO TABS: ORAL_TABLET | ORAL | 0 refills | Status: DC

## 2018-02-16 ENCOUNTER — Encounter: Payer: Self-pay | Admitting: Physician Assistant

## 2018-02-17 ENCOUNTER — Ambulatory Visit: Payer: BLUE CROSS/BLUE SHIELD | Admitting: Physician Assistant

## 2018-02-18 ENCOUNTER — Encounter: Payer: Self-pay | Admitting: Physician Assistant

## 2018-02-18 ENCOUNTER — Ambulatory Visit: Payer: BLUE CROSS/BLUE SHIELD | Admitting: Physician Assistant

## 2018-02-18 ENCOUNTER — Ambulatory Visit (INDEPENDENT_AMBULATORY_CARE_PROVIDER_SITE_OTHER): Payer: BLUE CROSS/BLUE SHIELD | Admitting: Physician Assistant

## 2018-02-18 VITALS — BP 129/71 | HR 91 | Temp 98.6°F | Ht 63.0 in | Wt 132.6 lb

## 2018-02-18 DIAGNOSIS — M9511 Cauliflower ear, right ear: Secondary | ICD-10-CM | POA: Diagnosis not present

## 2018-02-18 NOTE — Progress Notes (Signed)
BP 129/71   Pulse 91   Temp 98.6 F (37 C) (Oral)   Ht 5\' 3"  (1.6 m)   Wt 132 lb 9.6 oz (60.1 kg)   BMI 23.49 kg/m    Subjective:    Patient ID: Makayla RichesAutumn Peterson, female    DOB: [birth year], 22 y.o.   MRN: [id number]  HPI: Makayla Richesutumn Peterson is a 22 y.o. 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 A couple of wweks ago she had a sudden pain and swelling of the right ear.  She stated the pain was quite bad at first.  It has reduced in pain.  She has drained it herself.  She is an 18-gauge needle that she got from tractor supply.  She said it was bloody material but no pus.  I have drained her today with an 18-gauge and received half bloody fluid in the beginning of some serous fluid.  There was no pus seen I have encouraged her to see a plastic surgeon about getting this completely repaired.  She does have spring break March 3-8.   Past Medical History:  Diagnosis Date  . ADHD (attention deficit hyperactivity disorder)   . Oppositional defiant disorder    Relevant past medical, surgical, family and social history reviewed and updated as indicated. Interim medical history since our last visit reviewed. Allergies and medications reviewed and updated. DATA REVIEWED: CHART IN EPIC  Family History reviewed for pertinent findings.  Review of Systems  Constitutional: Negative.   HENT: Positive for ear pain. Negative for ear discharge.   Eyes: Negative.   Respiratory: Negative.   Gastrointestinal: Negative.   Genitourinary: Negative.     Allergies as of 02/18/2018      Reactions   Pine Needle [fir Oil] Shortness Of Breath   Amoxicillin Rash      Medication List        Accurate as of 02/18/18  4:06 PM. Always use your most recent med list.          albuterol 108 (90 Base) MCG/ACT inhaler Commonly known as:  PROVENTIL HFA;VENTOLIN HFA Inhale 2 puffs into the lungs every 6  (six) hours as needed for wheezing or shortness of breath.   citalopram 20 MG tablet Commonly known as:  CELEXA Take 1 tablet (20 mg total) by mouth daily.   desogestrel-ethinyl estradiol 0.15-0.02/0.01 MG (21/5) tablet Commonly known as:  AZURETTE Take 1 tablet by mouth daily.          Objective:    BP 129/71   Pulse 91   Temp 98.6 F (37 C) (Oral)   Ht 5\' 3"  (1.6 m)   Wt 132 lb 9.6 oz (60.1 kg)   BMI 23.49 kg/m   Allergies  Allergen Reactions  . Pine Needle [Fir Oil] Shortness Of Breath  . Amoxicillin Rash    Wt Readings from Last 3 Encounters:  02/18/18 132 lb 9.6 oz (60.1 kg)  11/19/17 128 lb 6.4 oz (58.2 kg)  09/24/17 133 lb (60.3 kg)    Physical Exam  Constitutional: She is oriented to person, place, and time. She appears well-developed and well-nourished.  HENT:  Head: Normocephalic and atraumatic.  Right Ear: There is swelling and tenderness. No drainage.  Ears:  Eyes: Conjunctivae and EOM are normal. Pupils are equal, round, and reactive to light.  Cardiovascular: Normal rate, regular rhythm, normal heart sounds and intact distal pulses.  Pulmonary/Chest: Effort normal  and breath sounds normal.  Abdominal: Soft. Bowel sounds are normal.  Neurological: She is alert and oriented to person, place, and time. She has normal reflexes.  Skin: Skin is warm and dry. No rash noted.  Psychiatric: She has a normal mood and affect. Her behavior is normal. Judgment and thought content normal.    No results found for this or any previous visit.    Assessment & Plan:   1. Cauliflower right ear Ice, ibuprofen - Ambulatory referral to Pediatric Plastic Surgery   Continue all other maintenance medications as listed above.  Follow up plan: No Follow-up on file.  Educational handout given for survey  Remus Loffler PA-C [facility] [address]  [place], [place] [address] [phone]   02/18/2018, 4:06 PM

## 2018-03-05 ENCOUNTER — Encounter: Payer: Self-pay | Admitting: Physician Assistant

## 2018-03-05 DIAGNOSIS — Z6823 Body mass index (BMI) 23.0-23.9, adult: Secondary | ICD-10-CM | POA: Diagnosis not present

## 2018-03-05 DIAGNOSIS — B308 Other viral conjunctivitis: Secondary | ICD-10-CM | POA: Diagnosis not present

## 2018-03-05 DIAGNOSIS — J00 Acute nasopharyngitis [common cold]: Secondary | ICD-10-CM | POA: Diagnosis not present

## 2018-03-06 ENCOUNTER — Ambulatory Visit: Payer: BLUE CROSS/BLUE SHIELD | Admitting: Pediatrics

## 2018-06-24 ENCOUNTER — Ambulatory Visit: Payer: BLUE CROSS/BLUE SHIELD | Admitting: Physician Assistant

## 2018-06-24 ENCOUNTER — Encounter: Payer: Self-pay | Admitting: Physician Assistant

## 2018-06-24 VITALS — BP 119/76 | HR 81 | Temp 99.0°F | Ht 63.0 in | Wt 130.8 lb

## 2018-06-24 DIAGNOSIS — F321 Major depressive disorder, single episode, moderate: Secondary | ICD-10-CM | POA: Diagnosis not present

## 2018-06-24 DIAGNOSIS — F419 Anxiety disorder, unspecified: Secondary | ICD-10-CM

## 2018-06-24 NOTE — Patient Instructions (Signed)
celexa 10 mg 2 weeks, then every other day BIOFEEDBACK

## 2018-06-25 NOTE — Progress Notes (Signed)
BP 119/76   Pulse 81   Temp 99 F (37.2 C) (Oral)   Ht 5\' 3"  (1.6 m)   Wt 130 lb 12.8 oz (59.3 kg)   BMI 23.17 kg/m    Subjective:    Patient ID: Makayla RichesAutumn Peterson, female    DOB: 1996-04-23, 22 y.o.   MRN: [id number]  HPI: Makayla Richesutumn Bucker is a 22 y.o. female presenting on 06/24/2018 for Depression (would like to discuss coming off of medication )  This patient comes in to discuss her depression and anxiety.  She will be entering her senior year of college at [employer].  She reports that over the past year of using the antidepressant has been greatly beneficial in her depression treatment.  Her anxiety still exists and she really does try to calm herself down.  It is been extremely helpful to have her pet with her on the college campus.  New forms will be filled out for her to have a comfort animal.  At this time work in the lower her depression medicine to 10 mg daily over the next month and anticipate a tapering.  If things do not stay better we will restart the medication.  She understands the plan  [facility] 06/24/2018 02/18/2018 11/19/2017 08/09/2017 07/12/2017  Decreased Interest 0 0 0 1 2  Down, Depressed, Hopeless 0 0 1 1 3   PHQ - 2 Score 0 0 1 2 5   Altered sleeping - - - 2 2  Tired, decreased energy - - - 1 2  Change in appetite - - - 1 3  Feeling bad or failure about yourself  - - - 1 2  Trouble concentrating - - - 2 1  Moving slowly or fidgety/restless - - - 0 1  Suicidal thoughts - - - 0 1  PHQ-9 Score - - - 9 17     Past Medical History:  Diagnosis Date  . ADHD (attention deficit hyperactivity disorder)   . Oppositional defiant disorder    Relevant past medical, surgical, family and social history reviewed and updated as indicated. Interim medical history since our last visit reviewed. Allergies and medications reviewed and updated. DATA REVIEWED: CHART IN EPIC  Family History reviewed for pertinent findings.  Review of Systems    Constitutional: Negative.   HENT: Negative.   Eyes: Negative.   Respiratory: Negative.   Gastrointestinal: Negative.   Genitourinary: Negative.   Psychiatric/Behavioral: Positive for dysphoric mood. The patient is nervous/anxious.     Allergies as of 06/24/2018      Reactions   Pine Needle [fir Oil] Shortness Of Breath   Pine Tar Shortness Of Breath   Amoxicillin Rash      Medication List        Accurate as of 06/24/18 11:59 PM. Always use your most recent med list.          albuterol 108 (90 Base) MCG/ACT inhaler Commonly known as:  PROVENTIL HFA;VENTOLIN HFA Inhale 2 puffs into the lungs every 6 (six) hours as needed for wheezing or shortness of breath.   citalopram 20 MG tablet Commonly known as:  CELEXA Take 1 tablet (20 mg total) by mouth daily.   desogestrel-ethinyl estradiol 0.15-0.02/0.01 MG (21/5) tablet Commonly known as:  AZURETTE Take 1 tablet by mouth daily.          Objective:    BP 119/76   Pulse 81   Temp 99 F (37.2 C) (Oral)   Ht 5\' 3"  (1.6  m)   Wt 130 lb 12.8 oz (59.3 kg)   BMI 23.17 kg/m   Allergies  Allergen Reactions  . Pine Needle [Fir Oil] Shortness Of Breath  . Pine Tar Shortness Of Breath  . Amoxicillin Rash    Wt Readings from Last 3 Encounters:  06/24/18 130 lb 12.8 oz (59.3 kg)  02/18/18 132 lb 9.6 oz (60.1 kg)  11/19/17 128 lb 6.4 oz (58.2 kg)    Physical Exam  Constitutional: She is oriented to person, place, and time. She appears well-developed and well-nourished.  HENT:  Head: Normocephalic and atraumatic.  Eyes: Pupils are equal, round, and reactive to light. Conjunctivae and EOM are normal.  Cardiovascular: Normal rate, regular rhythm, normal heart sounds and intact distal pulses.  Pulmonary/Chest: Effort normal and breath sounds normal.  Abdominal: Soft. Bowel sounds are normal.  Neurological: She is alert and oriented to person, place, and time. She has normal reflexes.  Skin: Skin is warm and dry. No rash  noted.  Psychiatric: She has a normal mood and affect. Her behavior is normal. Judgment and thought content normal.    No results found for this or any previous visit.    Assessment & Plan:   1. Depression, major, single episode, moderate (HCC) Reduce Celexa to 10 mg daily 2 weeks then every other day for a week then stop Call back if symptoms arise  2. Anxiety See above Comfort animal in her dorm room  Continue all other maintenance medications as listed above.  Follow up plan: No follow-ups on file.  Educational handout given for survey  Remus Loffler PA-C [facility] [address]  [place], [place] [address] [phone]   06/25/2018, 12:59 PM

## 2018-08-02 ENCOUNTER — Ambulatory Visit (INDEPENDENT_AMBULATORY_CARE_PROVIDER_SITE_OTHER): Payer: BLUE CROSS/BLUE SHIELD | Admitting: Family Medicine

## 2018-08-02 ENCOUNTER — Encounter: Payer: Self-pay | Admitting: Family Medicine

## 2018-08-02 VITALS — BP 121/75 | HR 93 | Temp 99.2°F | Ht 63.0 in | Wt 127.0 lb

## 2018-08-02 DIAGNOSIS — J029 Acute pharyngitis, unspecified: Secondary | ICD-10-CM

## 2018-08-02 MED ORDER — FLUTICASONE PROPIONATE 50 MCG/ACT NA SUSP: 1.0000 | Freq: Two times a day (BID) | NASAL | 6 refills | Status: DC | PRN

## 2018-08-02 MED ORDER — AZITHROMYCIN 250 MG PO TABS: ORAL_TABLET | ORAL | 0 refills | Status: DC

## 2018-08-02 NOTE — Progress Notes (Signed)
BP 121/75 (BP Location: Left Arm)   Pulse 93   Temp 99.2 F (37.3 C) (Oral)   Ht 5\' 3"  (1.6 m)   Wt 127 lb (57.6 kg)   LMP 07/12/2018   BMI 22.50 kg/m    Subjective:    Patient ID: Makayla RichesAutumn Couvillon, female    DOB: 04/30/1996, 22 y.o.   MRN: [id number]  HPI: Makayla Richesutumn Peterson is a 22 y.o. female presenting on 08/02/2018 for Sore Throat (HA, congestion ) and Cough   HPI Cough and sore throat headache congestion Patient comes in with complaints of cough and sore throat and headache and congestion that started all yesterday.  She says the sore throat started first and then she was really achy and then she developed a cough and congestion.  The cough is been productive of yellow-green sputum.  She denies knowing if she has any fevers but has felt chills and achiness since yesterday as well.  She denies any sick contacts that she knows of.  She denies any shortness of breath or wheezing.  Relevant past medical, surgical, family and social history reviewed and updated as indicated. Interim medical history since our last visit reviewed. Allergies and medications reviewed and updated.  Review of Systems  Constitutional: Positive for chills. Negative for fever.  HENT: Positive for congestion, postnasal drip, rhinorrhea, sinus pressure, sneezing and sore throat. Negative for ear discharge and ear pain.   Eyes: Negative for visual disturbance.  Respiratory: Positive for cough. Negative for chest tightness and shortness of breath.   Cardiovascular: Negative for chest pain and leg swelling.  Musculoskeletal: Positive for myalgias. Negative for back pain and gait problem.  Skin: Negative for rash.  Neurological: Negative for light-headedness and headaches.  Psychiatric/Behavioral: Negative for agitation and behavioral problems.  All other systems reviewed and are negative.   Per HPI unless specifically indicated above   Allergies as of 08/02/2018      Reactions   Pine Needle [fir Oil] Shortness Of  Breath   Pine Tar Shortness Of Breath   Amoxicillin Rash      Medication List        Accurate as of 08/02/18 11:03 AM. Always use your most recent med list.          albuterol 108 (90 Base) MCG/ACT inhaler Commonly known as:  PROVENTIL HFA;VENTOLIN HFA Inhale 2 puffs into the lungs every 6 (six) hours as needed for wheezing or shortness of breath.   desogestrel-ethinyl estradiol 0.15-0.02/0.01 MG (21/5) tablet Commonly known as:  AZURETTE Take 1 tablet by mouth daily.          Objective:    BP 121/75 (BP Location: Left Arm)   Pulse 93   Temp 99.2 F (37.3 C) (Oral)   Ht 5\' 3"  (1.6 m)   Wt 127 lb (57.6 kg)   LMP 07/12/2018   BMI 22.50 kg/m   Wt Readings from Last 3 Encounters:  08/02/18 127 lb (57.6 kg)  06/24/18 130 lb 12.8 oz (59.3 kg)  02/18/18 132 lb 9.6 oz (60.1 kg)    Physical Exam  Constitutional: She is oriented to person, place, and time. She appears well-developed and well-nourished. No distress.  HENT:  Right Ear: Tympanic membrane, external ear and ear canal normal.  Left Ear: Tympanic membrane, external ear and ear canal normal.  Nose: Mucosal edema and rhinorrhea present. No epistaxis. Right sinus exhibits no maxillary sinus tenderness and no frontal sinus tenderness. Left sinus exhibits no maxillary sinus tenderness and no frontal  sinus tenderness.  Mouth/Throat: Uvula is midline and mucous membranes are normal. Posterior oropharyngeal edema and posterior oropharyngeal erythema present. No oropharyngeal exudate or tonsillar abscesses.  Eyes: Conjunctivae and EOM are normal.  Cardiovascular: Normal rate, regular rhythm, normal heart sounds and intact distal pulses.  No murmur heard. Pulmonary/Chest: Effort normal and breath sounds normal. No respiratory distress. She has no wheezes.  Neurological: She is alert and oriented to person, place, and time. Coordination normal.  Skin: Skin is warm and dry. No rash noted. She is not diaphoretic.  Psychiatric:  She has a normal mood and affect. Her behavior is normal.  Vitals reviewed.   Rapid strep: Negative    Assessment & Plan:   Problem List Items Addressed This Visit    None    Visit Diagnoses    Pharyngitis, unspecified etiology    -  Primary   Relevant Medications   azithromycin (ZITHROMAX) 250 MG tablet   fluticasone (FLONASE) 50 MCG/ACT nasal spray   Other Relevant Orders   Rapid Strep Screen ([facility] ONLY)      Recommended for patient to use Flonase Mucinex and ibuprofen, if worsens or does not improve over the next few days then pick up an antibiotic  Follow up plan: Return if symptoms worsen or fail to improve.  Counseling provided for all of the vaccine components Orders Placed This Encounter  Procedures  . Rapid Strep Screen ([facility] ONLY)    Arville Care, MD [facility] 08/02/2018, 11:03 AM

## 2018-08-04 ENCOUNTER — Encounter: Payer: Self-pay | Admitting: Physician Assistant

## 2018-08-04 DIAGNOSIS — J029 Acute pharyngitis, unspecified: Secondary | ICD-10-CM

## 2018-08-04 LAB — RAPID STREP SCREEN (MED CTR MEBANE ONLY): STREP GP A AG, IA W/REFLEX: NEGATIVE

## 2018-08-04 LAB — CULTURE, GROUP A STREP

## 2018-08-04 MED ORDER — PREDNISONE 10 MG (21) PO TBPK: ORAL_TABLET | ORAL | 0 refills | Status: DC

## 2018-08-04 MED ORDER — AZITHROMYCIN 250 MG PO TABS: ORAL_TABLET | ORAL | 0 refills | Status: DC

## 2018-08-12 ENCOUNTER — Other Ambulatory Visit: Payer: Self-pay | Admitting: Physician Assistant

## 2018-08-19 ENCOUNTER — Encounter: Payer: Self-pay | Admitting: Physician Assistant

## 2018-08-20 ENCOUNTER — Other Ambulatory Visit: Payer: Self-pay | Admitting: Physician Assistant

## 2018-08-20 MED ORDER — CEFDINIR 300 MG PO CAPS: 300.0000 mg | ORAL_CAPSULE | Freq: Two times a day (BID) | ORAL | 0 refills | Status: DC

## 2018-09-15 ENCOUNTER — Telehealth: Payer: Self-pay | Admitting: Physician Assistant

## 2018-09-15 MED ORDER — DESOGESTREL-ETHINYL ESTRADIOL 0.15-0.02/0.01 MG (21/5) PO TABS: ORAL_TABLET | ORAL | 3 refills | Status: DC

## 2018-09-15 NOTE — Telephone Encounter (Signed)
Pt aware refill sent to new pharmacy

## 2018-11-19 ENCOUNTER — Ambulatory Visit: Payer: BLUE CROSS/BLUE SHIELD | Admitting: Physician Assistant

## 2018-12-05 ENCOUNTER — Other Ambulatory Visit: Payer: Self-pay | Admitting: Physician Assistant

## 2018-12-20 ENCOUNTER — Encounter: Payer: Self-pay | Admitting: Physician Assistant

## 2018-12-20 DIAGNOSIS — Z6822 Body mass index (BMI) 22.0-22.9, adult: Secondary | ICD-10-CM | POA: Diagnosis not present

## 2018-12-20 DIAGNOSIS — H1089 Other conjunctivitis: Secondary | ICD-10-CM | POA: Diagnosis not present

## 2018-12-25 ENCOUNTER — Ambulatory Visit: Payer: BLUE CROSS/BLUE SHIELD | Admitting: Family Medicine

## 2018-12-25 ENCOUNTER — Encounter: Payer: Self-pay | Admitting: Family Medicine

## 2018-12-25 VITALS — BP 121/79 | HR 80 | Temp 97.5°F | Ht 63.0 in | Wt 129.0 lb

## 2018-12-25 DIAGNOSIS — Z01818 Encounter for other preprocedural examination: Secondary | ICD-10-CM | POA: Diagnosis not present

## 2018-12-25 NOTE — Progress Notes (Signed)
Subjective:    Patient ID: Makayla Peterson, female    DOB: August 19, 1996, 22 y.o.   MRN: [id number]  Chief Complaint:  Surgical clearance (hip preservation surgery scheduled on 01/13/19)   HPI: Makayla Peterson is a 22 y.o. female presenting on 12/25/2018 for Surgical clearance (hip preservation surgery scheduled on 01/13/19)   1. Pre-operative clearance   Pt presents today for surgical clearance. States she is having surgery on her left hip on 01/13/2019. States she has previously had left shoulder surgery under general anesthesia. States she tolerated this well. Denies cardiac history. 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 Only has to use inhaler prior to sporting events or exercise. States she does not have to use inhaler at other times. Denies shortness of breath, palpitations, chest pain, leg swelling, cough, headaches, confusion, weakness, fever, chills, unintentional weight loss, or focal neuro deficits. Denies a family history of problems with anesthesia.   Relevant past medical, surgical, family, and social history reviewed and updated as indicated.  Allergies and medications reviewed and updated.   Past Medical History:  Diagnosis Date  . ADHD (attention deficit hyperactivity disorder)   . Oppositional defiant disorder     Past Surgical History:  Procedure Laterality Date  . SHOULDER ARTHROSCOPY Left   . SHOULDER SURGERY Left     Social History   Socioeconomic History  . Marital status: Single    Spouse name: Not on file  . Number of children: Not on file  . Years of education: Not on file  . Highest education level: Not on file  Occupational History  . Not on file  Social Needs  . Financial resource strain: Not on file  . Food insecurity:    Worry: Not on file    Inability: Not on file  . Transportation needs:    Medical: Not on file    Non-medical: Not on file  Tobacco Use  . Smoking status: Never Smoker  . Smokeless tobacco: Never Used  Substance and Sexual  Activity  . Alcohol use: Yes    Comment: occasional   . Drug use: No  . Sexual activity: Yes    Birth control/protection: Pill  Lifestyle  . Physical activity:    Days per week: Not on file    Minutes per session: Not on file  . Stress: Not on file  Relationships  . Social connections:    Talks on phone: Not on file    Gets together: Not on file    Attends religious service: Not on file    Active member of club or organization: Not on file    Attends meetings of clubs or organizations: Not on file    Relationship status: Not on file  . Intimate partner violence:    Fear of current or ex partner: Not on file    Emotionally abused: Not on file    Physically abused: Not on file    Forced sexual activity: Not on file  Other Topics Concern  . Not on file  Social History Narrative  . Not on file    Outpatient Encounter Medications as of 12/25/2018  Medication Sig  . desogestrel-ethinyl estradiol (KARIVA) 0.15-0.02/0.01 MG (21/5) tablet TAKE ONE (1) TABLET EACH DAY  . [DISCONTINUED] azithromycin (ZITHROMAX) 250 MG tablet Take 2 the first day and then one each day after.  . [DISCONTINUED] cefdinir (OMNICEF) 300 MG capsule Take 1 capsule (300 mg total) by mouth 2 (two) times daily. 1 po BID  . [DISCONTINUED] fluticasone (FLONASE)  50 MCG/ACT nasal spray Place 1 spray into both nostrils 2 (two) times daily as needed for allergies or rhinitis.  . [DISCONTINUED] predniSONE (STERAPRED UNI-PAK 21 TAB) 10 MG (21) TBPK tablet As directed x 6 days   No facility-administered encounter medications on file as of 12/25/2018.     Allergies  Allergen Reactions  . Pine Needle [Fir Oil] Shortness Of Breath  . Pine Tar Shortness Of Breath  . Amoxicillin Rash    Review of Systems  Constitutional: Negative for activity change, appetite change, chills, diaphoresis, fatigue, fever and unexpected weight change.  HENT: Negative.   Eyes: Negative for photophobia and visual disturbance.    Respiratory: Negative for apnea, cough, chest tightness, shortness of breath, wheezing and stridor.   Cardiovascular: Negative for chest pain, palpitations and leg swelling.  Gastrointestinal: Negative.   Endocrine: Negative.   Genitourinary: Negative.   Musculoskeletal: Positive for arthralgias (hip).  Skin: Negative for color change, pallor, rash and wound.  Allergic/Immunologic: Negative for immunocompromised state.  Neurological: Negative.   Hematological: Negative for adenopathy. Does not bruise/bleed easily.  Psychiatric/Behavioral: Negative for confusion and sleep disturbance.  All other systems reviewed and are negative.       Objective:    BP 121/79   Pulse 80   Temp (!) 97.5 F (36.4 C) (Oral)   Ht 5\' 3"  (1.6 m)   Wt 129 lb (58.5 kg)   BMI 22.85 kg/m    Wt Readings from Last 3 Encounters:  12/25/18 129 lb (58.5 kg)  08/02/18 127 lb (57.6 kg)  06/24/18 130 lb 12.8 oz (59.3 kg)    Physical Exam Vitals signs and nursing note reviewed.  Constitutional:      General: She is not in acute distress.    Appearance: Normal appearance. She is well-developed, well-groomed and normal weight. She is not ill-appearing.  HENT:     Head: Normocephalic and atraumatic.     Jaw: There is normal jaw occlusion.     Right Ear: Hearing, tympanic membrane, ear canal and external ear normal.     Left Ear: Hearing, tympanic membrane, ear canal and external ear normal.     Nose: Nose normal.     Mouth/Throat:     Lips: Pink.     Mouth: Mucous membranes are moist.     Pharynx: Oropharynx is clear.  Eyes:     Extraocular Movements: Extraocular movements intact.     Conjunctiva/sclera: Conjunctivae normal.     Pupils: Pupils are equal, round, and reactive to light.  Neck:     Musculoskeletal: Normal range of motion and neck supple.     Trachea: Trachea and phonation normal.  Cardiovascular:     Rate and Rhythm: Normal rate and regular rhythm.     Pulses: Normal pulses.      Heart sounds: Normal heart sounds. No murmur. No friction rub. No gallop.   Pulmonary:     Effort: Pulmonary effort is normal. No respiratory distress.     Breath sounds: Normal breath sounds.  Abdominal:     General: Abdomen is flat. Bowel sounds are normal. There is no distension.     Palpations: Abdomen is soft.     Tenderness: There is no abdominal tenderness. There is no right CVA tenderness or left CVA tenderness.  Musculoskeletal: Normal range of motion.  Skin:    General: Skin is warm and dry.     Capillary Refill: Capillary refill takes less than 2 seconds.  Neurological:     General:  No focal deficit present.     Mental Status: She is alert and oriented to person, place, and time.     Cranial Nerves: Cranial nerves are intact.     Sensory: Sensation is intact.     Motor: Motor function is intact.     Coordination: Coordination is intact.     Gait: Gait is intact.     Deep Tendon Reflexes: Reflexes are normal and symmetric.  Psychiatric:        Attention and Perception: Attention and perception normal.        Mood and Affect: Mood and affect normal.        Speech: Speech normal.        Behavior: Behavior normal. Behavior is cooperative.        Thought Content: Thought content normal.        Cognition and Memory: Cognition and memory normal.        Judgment: Judgment normal.     Results for orders placed or performed in visit on 08/02/18  Rapid Strep Screen ([facility] ONLY)  Result Value Ref Range   Strep Gp A Ag, IA W/Reflex Negative Negative  Culture, Group A Strep  Result Value Ref Range   Strep A Culture CANCELED        Pertinent labs & imaging results that were available during my care of the patient were reviewed by me and considered in my medical decision making.  Assessment & Plan:  Renesmee was seen today for surgical clearance.  Diagnoses and all orders for this visit:  Pre-operative clearance Health maintenance discussed. Facility will complete  preoperative labs prior to surgery.    Continue all other maintenance medications.  Follow up plan: Return if symptoms worsen or fail to improve.  Educational handout given for health maintenance   The above assessment and management plan was discussed with the patient. The patient verbalized understanding of and has agreed to the management plan. Patient is aware to call the clinic if symptoms persist or worsen. Patient is aware when to return to the clinic for a follow-up visit. Patient educated on when it is appropriate to go to the emergency department.   Kari Baars, FNP-C [facility] [phone]

## 2018-12-25 NOTE — Patient Instructions (Signed)

## 2019-05-04 ENCOUNTER — Other Ambulatory Visit: Payer: Self-pay | Admitting: Physician Assistant

## 2019-06-02 ENCOUNTER — Telehealth: Payer: Self-pay | Admitting: Physician Assistant

## 2019-06-02 NOTE — Telephone Encounter (Signed)
Patient aware letter up front to be picked up 

## 2019-06-02 NOTE — Telephone Encounter (Signed)
Can you check with her on what kind of job and I am sure she is cleared from the surgery. I am thinking that it should be fine.

## 2019-06-02 NOTE — Telephone Encounter (Signed)
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

## 2019-06-02 NOTE — Telephone Encounter (Signed)
Jones, can you do this or do you prefer she reach out to her surgeon from [facility] 2020.

## 2019-06-02 NOTE — Telephone Encounter (Signed)
Ok to write note of clearance to work

## 2019-07-13 ENCOUNTER — Other Ambulatory Visit: Payer: Self-pay | Admitting: Physician Assistant

## 2022-05-15 ENCOUNTER — Telehealth: Payer: Self-pay | Admitting: Licensed Clinical Social Worker

## 2022-05-15 NOTE — Telephone Encounter (Signed)
Patient's husband, Lyman Bishop left vm reporting wife was having postpartum symptoms. LCSW returned call and spoke with patient. She reported she has insurance and money to pay for co-pays LCSW encouraged her to look at her insurance for providers that accept her insurance, provided information on PSI, and encouraged her to reach out if she has further concerns.  ?

## 2023-06-12 ENCOUNTER — Emergency Department
Admission: EM | Admit: 2023-06-12 | Discharge: 2023-06-12 | Disposition: A | Discharge status: Home / Self Care | Payer: Managed Care, Other (non HMO) | Attending: Emergency Medicine | Admitting: Emergency Medicine

## 2023-06-12 ENCOUNTER — Encounter: Payer: Self-pay | Admitting: Emergency Medicine

## 2023-06-12 ENCOUNTER — Other Ambulatory Visit: Payer: Self-pay

## 2023-06-12 DIAGNOSIS — R079 Chest pain, unspecified: Secondary | ICD-10-CM

## 2023-06-12 DIAGNOSIS — R0789 Other chest pain: Secondary | ICD-10-CM | POA: Diagnosis not present

## 2023-06-12 DIAGNOSIS — R1013 Epigastric pain: Secondary | ICD-10-CM | POA: Diagnosis not present

## 2023-06-12 LAB — CBC
HCT: 42.7 % (ref 36.0–46.0)
Hemoglobin: 14.7 g/dL (ref 12.0–15.0)
MCH: 28.5 pg (ref 26.0–34.0)
MCHC: 34.4 g/dL (ref 30.0–36.0)
MCV: 82.9 fL (ref 80.0–100.0)
Platelets: 242 10*3/uL (ref 150–400)
RBC: 5.15 MIL/uL — ABNORMAL HIGH (ref 3.87–5.11)
RDW: 11.9 % (ref 11.5–15.5)
WBC: 7.5 10*3/uL (ref 4.0–10.5)
nRBC: 0 % (ref 0.0–0.2)

## 2023-06-12 LAB — BASIC METABOLIC PANEL
Anion gap: 8 (ref 5–15)
BUN: 15 mg/dL (ref 6–20)
CO2: 20 mmol/L — ABNORMAL LOW (ref 22–32)
Calcium: 8.5 mg/dL — ABNORMAL LOW (ref 8.9–10.3)
Chloride: 108 mmol/L (ref 98–111)
Creatinine, Ser: 0.72 mg/dL (ref 0.44–1.00)
GFR, Estimated: >60 mL/min (ref >60)
Glucose, Bld: 115 mg/dL — ABNORMAL HIGH (ref 70–99)
Potassium: 3.2 mmol/L — ABNORMAL LOW (ref 3.5–5.1)
Sodium: 136 mmol/L (ref 135–145)

## 2023-06-12 LAB — POC URINE PREG, ED: Preg Test, Ur: POSITIVE — AB

## 2023-06-12 LAB — TROPONIN I (HIGH SENSITIVITY): Troponin I (High Sensitivity): <2 ng/L (ref <18)

## 2023-06-12 NOTE — ED Triage Notes (Signed)
Pt to ED via POV for chest pain that started 1 hour PTA. Pt states that this has been going on intermittently for a couple of months. Pt has been her PCP. Pt states that she was laying in bed this morning when the pain started. Pt was on crying on arrival because of how bad the pain was. Pt states that the gets worse when she takes a deep breath in. Pt also states that she is currently about [redacted] weeks pregnant. Pt also breast feeding currently.

## 2023-06-12 NOTE — Discharge Instructions (Addendum)
As we discussed your workup in the emergency department today is reassuring.  Return to the emergency department for any significant worsening chest pain any trouble breathing or any other symptom concerning to yourself.  Otherwise please use over-the-counter liquid Maalox max to see if this helps with any of your discomfort if it recurs.  If you continue to have discomfort/recurrent discomfort please call the number provided for GI medicine to arrange a follow-up appointment.

## 2023-06-12 NOTE — ED Notes (Signed)
Dc instructions reviewed with pt no questions or concerns at this time.  

## 2023-06-23 NOTE — ED Provider Notes (Signed)
   [facility] Provider Note    Event Date/Time   First MD Initiated Contact with Patient 06/12/23 [phone]     (approximate)  History   Chief Complaint: Chest Pain  HPI  Makayla Peterson is a 27 y.o. female with a past medical history of EHD presents to the emergency department for epigastric/chest pain.  Patient states for last few months she has intermittently been getting epigastric pain that radiates up into her chest often times when she is lying in bed.  Reassuring vital signs.  Physical Exam   Triage Vital Signs: ED Triage Vitals [06/12/23 0751]  Enc Vitals Group     BP 123/88     Pulse Rate 93     Resp 16     Temp 98.5 F (36.9 C)     Temp Source Oral     SpO2 97 %     Weight 125 lb (56.7 kg)     Height 5\' 4"  (1.626 m)     Head Circumference      Peak Flow      Pain Score 3     Pain Loc      Pain Edu?      Excl. in GC?     Most recent vital signs: Vitals:   06/12/23 0751 06/12/23 0906  BP: 123/88 120/77  Pulse: 93 88  Resp: 16 16  Temp: 98.5 F (36.9 C) 98.5 F (36.9 C)  SpO2: 97% 99%    General: Awake, no distress.  CV:  Good peripheral perfusion.  Regular rate and rhythm  Resp:  Normal effort.  Equal breath sounds bilaterally.  Abd:  No distention.  Soft, nontender.  No rebound or guarding. Other:     ED Results / Procedures / Treatments   EKG  EKG viewed and interpreted by myself shows sinus tachycardia 107 bpm the narrow QRS, normal axis, normal intervals, nonspecific but no concerning ST changes.  MEDICATIONS ORDERED IN ED: Medications - No data to display   IMPRESSION / MDM / ASSESSMENT AND PLAN / ED COURSE  I reviewed the triage vital signs and the nursing notes.  Patient's presentation is most consistent with acute presentation with potential threat to life or bodily function.  Patient presents emergency department for chest/epigastric discomfort described more as a reflux type sensation worse at night when lying  down.  Patient's workup in the emergency department overall reassuring with a normal CBC normal chemistry troponin is negative and EKG shows mild tachycardia but no other concerning findings.  No pleuritic nature to the pain.  Patient does have a positive pregnancy test but no clinical concern for PE.  Highly suspect more of a reflux cause for the patient's discomfort recommended Maalox and PCP follow-up.  Discussed my typical chest pain return precautions.  Patient agreeable to plan.  FINAL CLINICAL IMPRESSION(S) / ED DIAGNOSES   Epigastric pain   Rx / DC Orders   Maalox  Note:  This document was prepared using Dragon voice recognition software and may include unintentional dictation errors.   Minna Antis, MD 06/23/23 1427
